# Patient Record
Sex: Female | Born: 2000 | Race: White | Hispanic: No | Marital: Single | Smoking: Never smoker
Health system: Southern US, Community
[De-identification: ages and names within clinical notes are randomized; demographics above are authoritative.]

## PROBLEM LIST (undated history)

## (undated) DIAGNOSIS — F32A Depression, unspecified: Secondary | ICD-10-CM

## (undated) DIAGNOSIS — F319 Bipolar disorder, unspecified: Secondary | ICD-10-CM

## (undated) DIAGNOSIS — F329 Major depressive disorder, single episode, unspecified: Secondary | ICD-10-CM

---
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
## 2024-03-08 ENCOUNTER — Ambulatory Visit: Payer: Self-pay | Admitting: Podiatry
